# Patient Record
Sex: Female | Born: 1980 | Race: Black or African American | Hispanic: No | Marital: Single | Smoking: Never smoker
Health system: Southern US, Community
[De-identification: ages and names within clinical notes are randomized; demographics above are authoritative.]

## PROBLEM LIST (undated history)

## (undated) DIAGNOSIS — R51 Headache: Secondary | ICD-10-CM

## (undated) DIAGNOSIS — F988 Other specified behavioral and emotional disorders with onset usually occurring in childhood and adolescence: Secondary | ICD-10-CM

## (undated) DIAGNOSIS — T7840XA Allergy, unspecified, initial encounter: Secondary | ICD-10-CM

## (undated) HISTORY — PX: CHOLECYSTECTOMY: SHX55

## (undated) HISTORY — DX: Headache: R51

## (undated) HISTORY — DX: Allergy, unspecified, initial encounter: T78.40XA

## (undated) HISTORY — DX: Other specified behavioral and emotional disorders with onset usually occurring in childhood and adolescence: F98.8

---
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
## 2001-02-24 ENCOUNTER — Emergency Department (HOSPITAL_COMMUNITY): Admission: EM | Admit: 2001-02-24 | Discharge: 2001-02-24 | Payer: Self-pay | Admitting: Emergency Medicine

## 2001-02-24 ENCOUNTER — Encounter: Payer: Self-pay | Admitting: Emergency Medicine

## 2002-02-19 ENCOUNTER — Encounter: Payer: Self-pay | Admitting: Emergency Medicine

## 2002-02-19 ENCOUNTER — Emergency Department (HOSPITAL_COMMUNITY): Admission: AC | Admit: 2002-02-19 | Discharge: 2002-02-19 | Payer: Self-pay

## 2004-02-17 ENCOUNTER — Ambulatory Visit: Payer: Self-pay | Admitting: Family Medicine

## 2004-03-09 ENCOUNTER — Ambulatory Visit: Payer: Self-pay | Admitting: Family Medicine

## 2005-06-01 ENCOUNTER — Ambulatory Visit: Payer: Self-pay | Admitting: Family Medicine

## 2005-06-04 ENCOUNTER — Encounter: Admission: RE | Admit: 2005-06-04 | Discharge: 2005-06-04 | Payer: Self-pay | Admitting: Family Medicine

## 2005-06-05 ENCOUNTER — Ambulatory Visit: Payer: Self-pay | Admitting: Family Medicine

## 2005-06-27 ENCOUNTER — Ambulatory Visit (HOSPITAL_COMMUNITY): Admission: RE | Admit: 2005-06-27 | Discharge: 2005-06-27 | Payer: Self-pay | Admitting: Surgery

## 2005-06-27 ENCOUNTER — Encounter (INDEPENDENT_AMBULATORY_CARE_PROVIDER_SITE_OTHER): Payer: Self-pay | Admitting: *Deleted

## 2005-09-13 ENCOUNTER — Ambulatory Visit: Payer: Self-pay | Admitting: Family Medicine

## 2005-09-13 ENCOUNTER — Encounter: Payer: Self-pay | Admitting: Family Medicine

## 2005-09-13 ENCOUNTER — Other Ambulatory Visit: Admission: RE | Admit: 2005-09-13 | Discharge: 2005-09-13 | Payer: Self-pay | Admitting: Family Medicine

## 2006-02-25 ENCOUNTER — Ambulatory Visit: Payer: Self-pay | Admitting: Family Medicine

## 2006-04-30 ENCOUNTER — Ambulatory Visit: Payer: Self-pay | Admitting: Family Medicine

## 2006-10-24 ENCOUNTER — Telehealth (INDEPENDENT_AMBULATORY_CARE_PROVIDER_SITE_OTHER): Payer: Self-pay | Admitting: *Deleted

## 2006-11-01 ENCOUNTER — Ambulatory Visit: Payer: Self-pay | Admitting: Family Medicine

## 2006-11-01 DIAGNOSIS — J45909 Unspecified asthma, uncomplicated: Secondary | ICD-10-CM

## 2006-11-01 DIAGNOSIS — F988 Other specified behavioral and emotional disorders with onset usually occurring in childhood and adolescence: Secondary | ICD-10-CM | POA: Insufficient documentation

## 2006-11-01 DIAGNOSIS — J309 Allergic rhinitis, unspecified: Secondary | ICD-10-CM | POA: Insufficient documentation

## 2007-06-14 ENCOUNTER — Inpatient Hospital Stay (HOSPITAL_COMMUNITY): Admission: AD | Admit: 2007-06-14 | Discharge: 2007-06-14 | Payer: Self-pay | Admitting: Obstetrics and Gynecology

## 2007-06-14 ENCOUNTER — Inpatient Hospital Stay (HOSPITAL_COMMUNITY): Admission: AD | Admit: 2007-06-14 | Discharge: 2007-06-17 | Payer: Self-pay | Admitting: Obstetrics and Gynecology

## 2007-07-22 ENCOUNTER — Ambulatory Visit: Payer: Self-pay | Admitting: Family Medicine

## 2007-07-22 DIAGNOSIS — R519 Headache, unspecified: Secondary | ICD-10-CM | POA: Insufficient documentation

## 2007-07-22 DIAGNOSIS — R51 Headache: Secondary | ICD-10-CM

## 2007-07-22 DIAGNOSIS — M79609 Pain in unspecified limb: Secondary | ICD-10-CM

## 2008-02-22 DIAGNOSIS — J069 Acute upper respiratory infection, unspecified: Secondary | ICD-10-CM | POA: Insufficient documentation

## 2008-02-24 ENCOUNTER — Ambulatory Visit: Payer: Self-pay | Admitting: Family Medicine

## 2008-02-25 ENCOUNTER — Telehealth: Payer: Self-pay | Admitting: Family Medicine

## 2008-02-26 ENCOUNTER — Encounter: Payer: Self-pay | Admitting: Family Medicine

## 2008-04-19 ENCOUNTER — Telehealth: Payer: Self-pay | Admitting: *Deleted

## 2008-04-20 ENCOUNTER — Encounter: Payer: Self-pay | Admitting: Family Medicine

## 2009-02-28 ENCOUNTER — Telehealth (INDEPENDENT_AMBULATORY_CARE_PROVIDER_SITE_OTHER): Payer: Self-pay | Admitting: *Deleted

## 2009-05-26 ENCOUNTER — Ambulatory Visit: Payer: Self-pay | Admitting: Family Medicine

## 2009-05-26 DIAGNOSIS — D649 Anemia, unspecified: Secondary | ICD-10-CM

## 2009-05-26 DIAGNOSIS — R1084 Generalized abdominal pain: Secondary | ICD-10-CM

## 2009-05-26 LAB — CONVERTED CEMR LAB: Hemoglobin: 15.4 g/dL

## 2009-08-11 ENCOUNTER — Telehealth: Payer: Self-pay | Admitting: *Deleted

## 2010-03-21 NOTE — Progress Notes (Signed)
Summary: Pt req script for Adderall XR  Phone Note Refill Request Call back at Home Phone [phone]   Refills Requested: Medication #1:  ADDERALL XR 20 MG XR24H-CAP Take 1 capsule by mouth once a day   Supply Requested: 1 month  Method Requested: Pick up at Office Initial call taken by: Lucy Antigua,  August 11, 2009 11:00 AM    Prescriptions: ADDERALL XR 20 MG XR24H-CAP (AMPHETAMINE-DEXTROAMPHETAMINE) Take 1 capsule by mouth once a day --FILL IN TWO MONTHS  #30 x 0   Entered by:   Kern Reap CMA (AAMA)   Authorized by:   Roderick Pee MD   Signed by:   Kern Reap CMA (AAMA) on 08/11/2009   Method used:   Print then Give to Patient   RxID:   [id number] ADDERALL XR 20 MG XR24H-CAP (AMPHETAMINE-DEXTROAMPHETAMINE) Take 1 capsule by mouth once a day --FILL IN ONE MONTH  #30 x 0   Entered by:   Kern Reap CMA (AAMA)   Authorized by:   Roderick Pee MD   Signed by:   Kern Reap CMA (AAMA) on 08/11/2009   Method used:   Print then Give to Patient   RxID:   [id number] ADDERALL XR 20 MG XR24H-CAP (AMPHETAMINE-DEXTROAMPHETAMINE) Take 1 capsule by mouth once a day  #30 x 0   Entered by:   Kern Reap CMA (AAMA)   Authorized by:   Roderick Pee MD   Signed by:   Kern Reap CMA (AAMA) on 08/11/2009   Method used:   Print then Give to Patient   RxID:   [id number]

## 2010-03-21 NOTE — Progress Notes (Signed)
Summary: adderall rx lower dose  Phone Note Call from Patient Call back at Home Phone [phone]   Call For: todd Summary of Call: Wants prescription for lower dose of the adderall, the first he prescribed before the 25.  Call for questions or when ready to be picked up.   Also she is calling her pharmacy to get refill of the allegra d. Initial call taken by: Rudy Jew, RN,  February 28, 2009 10:54 AM  Follow-up for Phone Call        decrease dose to 20 mg, dispense 30 tablets, directions one daily x 3 months???????? regular or long-acting???????? please call patient  I recommend over-the-counter Claritin, 10 mg in the morning or 10 mg a Zyrtec plain  at bedtime  .Marland Kitchenno D Follow-up by: Roderick Pee MD,  February 28, 2009 10:58 AM  Additional Follow-up for Phone Call Additional follow up Details #1::        She doesn't know about the regular or longacting, but says the same as she had before.  About the claritin or zyrtec no D, she said okay , but she took the allegra D before with the adderall & asks why no D.  Please call her when the Rx is ready for pickup. Additional Follow-up by: Rudy Jew, RN,  February 28, 2009 1:45 PM    Additional Follow-up for Phone Call Additional follow up Details #2::    ok for rx x 3 mo    20 mg ? reg or XR ?????? Follow-up by: Roderick Pee MD,  February 28, 2009 6:51 PM  Additional Follow-up for Phone Call Additional follow up Details #3:: Details for Additional Follow-up Action Taken: on paper chart pt was on adderall 20mg  XR.  Will do Rx and wait for signature.Megan Riffle, RN  March 01, 2009 10:44 AM   patient aware prescription ready to pick up.Marland KitchenMarland KitchenMarland KitchenDoristine Castro  March 03, 2009 9:52 AM   New/Updated Medications: ADDERALL XR 20 MG XR24H-CAP (AMPHETAMINE-DEXTROAMPHETAMINE) Take 1 capsule by mouth once a day CLARITIN 10 MG TABS (LORATADINE) once daily as needed ZYRTEC ALLERGY 10 MG TABS (CETIRIZINE HCL) once daily as  needed ADDERALL XR 20 MG XR24H-CAP (AMPHETAMINE-DEXTROAMPHETAMINE) Take 1 capsule by mouth once a day --FILL IN ONE MONTH ADDERALL XR 20 MG XR24H-CAP (AMPHETAMINE-DEXTROAMPHETAMINE) Take 1 capsule by mouth once a day --FILL IN TWO MONTHS Prescriptions: ADDERALL XR 20 MG XR24H-CAP (AMPHETAMINE-DEXTROAMPHETAMINE) Take 1 capsule by mouth once a day --FILL IN TWO MONTHS  #30 x 0   Entered by:   Megan Riffle, RN   Authorized by:   Roderick Pee MD   Signed by:   Megan Riffle, RN on 03/01/2009   Method used:   Print then Give to Patient   RxID:   [id number] ADDERALL XR 20 MG XR24H-CAP (AMPHETAMINE-DEXTROAMPHETAMINE) Take 1 capsule by mouth once a day --FILL IN ONE MONTH  #30 x 0   Entered by:   Megan Riffle, RN   Authorized by:   Roderick Pee MD   Signed by:   Megan Riffle, RN on 03/01/2009   Method used:   Print then Give to Patient   RxID:   [id number] ADDERALL XR 20 MG XR24H-CAP (AMPHETAMINE-DEXTROAMPHETAMINE) Take 1 capsule by mouth once a day  #30 x 0   Entered by:   Megan Riffle, RN   Authorized by:   Roderick Pee MD   Signed by:   Megan Riffle, RN on 03/01/2009   Method used:  Print then Give to Patient   RxID:   [id number]

## 2010-03-21 NOTE — Assessment & Plan Note (Signed)
Summary: ?iron/njr   Vital Signs:  Patient profile:   30 year old female Height:      67 inches Weight:      209 pounds BMI:     32.85 Temp:     98.4 degrees F oral BP sitting:   102 / 72  (left arm) Cuff size:   regular  Vitals Entered By: Kern Reap CMA Duncan Dull) (May 26, 2009 2:53 PM) CC: iron levels Is Patient Diabetic? No   CC:  iron levels.  History of Present Illness: Megan Castro is a 30 year old single female, nonsmoker, who comes in today for evaluation of abdominal pain, and possible anemia.  About 4 years ago.  She was having abdominal pain.  It that time.  Diagnostic study showed the cholecystitis.  The gallbladder was removed, and she did well.  Since that, time.  She's had intermittent episodes of postprandial abdominal pain.  She sometimes has diarrhea.  She has no fever, vomiting, or weight loss.  She has a 29-year-old at home.  Postpartum she was anemic.  She wants to know she, is she's anemic now.  Hemoglobin 15.4.  Currently not sexually active therefore, no birth control  Allergies (verified): No Known Drug Allergies  Past History:  Past medical, surgical, family and social histories (including risk factors) reviewed for relevance to current acute and chronic problems.  Past Medical History: Reviewed history from 07/22/2007 and no changes required. Allergic rhinitis Asthma ADD childbirth x 1 to 5 weeks postpartum Headache  Family History: Reviewed history from 07/22/2007 and no changes required. Family History of Colon CA 1st degree relative <60  Social History: Reviewed history from 11/01/2006 and no changes required. Never Smoked Single Alcohol use-no  Review of Systems      See HPI  Physical Exam  General:  Well-developed,well-nourished,in no acute distress; alert,appropriate and cooperative throughout examination Abdomen:  Bowel sounds positive,abdomen soft and non-tender without masses, organomegaly or hernias noted.   Impression &  Recommendations:  Problem # 1:  ANEMIA (ICD-285.9) Assessment Improved  Orders: Hgb (85018) Fingerstick (16109)  Problem # 2:  ABDOMINAL PAIN, GENERALIZED (ICD-789.07) Assessment: New  Complete Medication List: 1)  Adderall Xr 20 Mg Xr24h-cap (Amphetamine-dextroamphetamine) .... Take 1 capsule by mouth once a day 2)  Zyrtec Allergy 10 Mg Tabs (Cetirizine hcl) .... Once daily as needed 3)  Adderall Xr 20 Mg Xr24h-cap (Amphetamine-dextroamphetamine) .... Take 1 capsule by mouth once a day --fill in one month 4)  Adderall Xr 20 Mg Xr24h-cap (Amphetamine-dextroamphetamine) .... Take 1 capsule by mouth once a day --fill in two months 5)  Questran Light 4 Gm Pack (Cholestyramine light) .Marland Kitchen.. 1 scoop qam  Patient Instructions: 1)  begin Questran one scoop daily.  Also avoid fatty foods.  Return p.r.n. Prescriptions: QUESTRAN LIGHT 4 GM PACK (CHOLESTYRAMINE LIGHT) 1 scoop qam  #PP x 11   Entered and Authorized by:   Roderick Pee MD   Signed by:   Roderick Pee MD on 05/26/2009   Method used:   Print then Give to Patient   RxID:   [id number]   Laboratory Results   Blood Tests   Date/Time Received: May 26, 2009     CBC   HGB:  15.4 g/dL   (Normal Range: 95.6-21.3 in Males, 12.0-15.0 in Females) Comments: Kern Reap CMA Duncan Dull)  May 26, 2009 3:05 PM

## 2010-04-06 ENCOUNTER — Other Ambulatory Visit: Payer: Self-pay | Admitting: Family Medicine

## 2010-04-06 DIAGNOSIS — F988 Other specified behavioral and emotional disorders with onset usually occurring in childhood and adolescence: Secondary | ICD-10-CM

## 2010-04-06 MED ORDER — AMPHETAMINE-DEXTROAMPHET ER 20 MG PO CP24: 20.0000 mg | ORAL_CAPSULE | ORAL | Status: DC

## 2010-04-06 NOTE — Telephone Encounter (Signed)
Pt needs a refill on med: Adderall .... Pt can be reached at [phone].

## 2010-04-06 NOTE — Telephone Encounter (Signed)
rx ready for pickup 

## 2010-05-14 ENCOUNTER — Emergency Department (HOSPITAL_BASED_OUTPATIENT_CLINIC_OR_DEPARTMENT_OTHER)
Admission: EM | Admit: 2010-05-14 | Discharge: 2010-05-14 | Disposition: A | Discharge status: Home / Self Care | Payer: BC Managed Care – PPO | Attending: Emergency Medicine | Admitting: Emergency Medicine

## 2010-05-14 DIAGNOSIS — J45909 Unspecified asthma, uncomplicated: Secondary | ICD-10-CM | POA: Insufficient documentation

## 2010-05-14 DIAGNOSIS — R05 Cough: Secondary | ICD-10-CM | POA: Insufficient documentation

## 2010-05-14 DIAGNOSIS — R059 Cough, unspecified: Secondary | ICD-10-CM | POA: Insufficient documentation

## 2010-07-03 ENCOUNTER — Encounter: Payer: Self-pay | Admitting: Family Medicine

## 2010-07-03 ENCOUNTER — Ambulatory Visit (INDEPENDENT_AMBULATORY_CARE_PROVIDER_SITE_OTHER): Payer: BC Managed Care – PPO | Admitting: Family Medicine

## 2010-07-03 VITALS — BP 102/80 | Temp 98.1°F | Wt 233.0 lb

## 2010-07-03 DIAGNOSIS — H0016 Chalazion left eye, unspecified eyelid: Secondary | ICD-10-CM

## 2010-07-03 DIAGNOSIS — H0019 Chalazion unspecified eye, unspecified eyelid: Secondary | ICD-10-CM

## 2010-07-04 ENCOUNTER — Telehealth: Payer: Self-pay | Admitting: *Deleted

## 2010-07-04 MED ORDER — PREDNISONE 20 MG PO TABS: 20.0000 mg | ORAL_TABLET | Freq: Every day | ORAL | Status: DC

## 2010-07-04 NOTE — Telephone Encounter (Signed)
patient  States that her adderall was to be increased.  Is this okay to fill?

## 2010-07-07 NOTE — Op Note (Signed)
NAMEJEFFRIE, STANDER              ACCOUNT NO.:  [id number]   MEDICAL RECORD NO.:  [id number]          PATIENT TYPE:  AMB   LOCATION:  DAY                          FACILITY:  [facility]   PHYSICIAN:  Thomas A. Cornett, M.D.DATE OF BIRTH:  05-Mar-1980   DATE OF PROCEDURE:  06/27/2005  DATE OF DISCHARGE:                                 OPERATIVE REPORT   PREOPERATIVE DIAGNOSIS:  Symptomatic cholelithiasis.   POSTOPERATIVE DIAGNOSIS:  Symptomatic cholelithiasis.   PROCEDURE:  Laparoscopic cholecystectomy with cholangiogram.   SURGEON:  Dr. Harriette Bouillon.   ASSISTANT:  Dr. Lebron Conners.   ANESTHESIA:  General endotracheal anesthesia with 10 mL of 0.5% Sensorcaine  local.   ESTIMATED BLOOD LOSS:  10 mL.   SPECIMEN:  Gallbladder with gallstone to pathology.   INTRAOPERATIVE FINDINGS:  Intraoperative cholangiogram revealed air bubbles  but no ductal dilatation with free flow of contrast into the duodenum and  down the common duct, up the common hepatic duct into a bifurcation.   INDICATIONS FOR PROCEDURE:  The patient is a 24-year female with symptomatic  cholelithiasis.  It has become quite disabling for her and she wished to  have surgery after having recurrent attacks requiring pain medicine and  doctor visits.  I discussed the procedure as well as the complications with  the patient which include bleeding, infection, common duct injury, injury to  other organs.  She understood the potential complications and agreed to  proceed.   DESCRIPTION OF PROCEDURE:  The patient was brought to the operating room and  placed supine.  After induction of general endotracheal anesthesia, her  abdomen was prepped and draped in a sterile fashion. A 1 cm supraumbilical  incision was made and dissection was carried down to her fascia.  The fascia  was grasped with a Kocher, a small incision was made in the fascia.  I was  able to place a small hemostat through the preperitoneal space through  the  peritoneal lining into the abdominal cavity without difficulty under direct  vision.  A pursestring suture of #0 Vicryl was placed at this point around  the fascial opening and an 11-mm Hasson cannula was placed under direct  vision.  Pneumoperitoneum was created to 15 mmHg with CO2 and a laparoscope  was placed.  Laparoscopy was performed.  No signs of solid or hollow organ  injury.  Next, a 10 mm subxiphoid port was placed under direct vision.  Two  5 mm ports were placed in the right mid abdomen, both under direct vision.  The gallbladder was identified and grasped by its dome and retracted toward  the patient's right shoulder.  A second grasper was used to grab the  infundibulum and retracted toward the patient's right lower quadrant.  There  is a very large node of Calot noted.  I was able to create a plane between  the node and the cystic duct circumferentially and dissect this out.  Once I  did this, I pushed the node away to give me more space and the cystic duct  was the only tubular structuring entering the gallbladder.  I  placed a clip  on the gallbladder side of this.  I then made a small incision in the cystic  duct and placed a cholangiogram catheter through a separate stab incision.  Intraoperative cholangiogram was then done which showed free flow of  contrast down the cystic duct into the common duct into the duodenum.  There  was free flow of contrast in the common hepatic ducts with a bifurcation to  the right and left hepatic duct.  At this point in time, there were some air  bubbles that I noted but these with flushing of contrast became more  difficult to see and then disappeared when I reviewed the films in the  operating room. They also behaved very much like air bubbles with changing  in size as I flushed.  At this point in time, I did not see any evidence of  obstruction.  These appeared to be air bubbles since I could not visualize  them anymore with further  flushing.  At this point in time, I completed the  cholangiogram, placed three clips on the cystic duct after removing the  catheter and divided the remainder of the cystic duct.  Next, the cystic  artery was identified just above the node of Calot. This was dissected out  with a right angle and two clips were placed on this side of it and this was  reduced.  At this point, the cautery was used to dissect the gallbladder  from the gallbladder fossa.  There were small posterior branches from the  cystic artery on the gallbladder that I controlled with clips in the  gallbladder bed.  I then used the cautery to dissect the remainder of the  gallbladder from gallbladder fossa without difficulty.  At this point in  time, I removed the gallbladder with an EndoCatch bag through the umbilicus.  I inspected the gallbladder bed and found it to be hemostatic.  Irrigation  was used and this was clear.  At this point in time, I removed all ports  under direct vision with no signs of port site bleeding.  Once the ports  were removed, the CO2 and the camera were released and removed respectively.  The umbilical port was removed.  I closed the umbilical port fascia with the  pursestring #0 Vicryl suture, 4-0 Monocryl was used to close all skin  incisions.  Sterile dressings were applied.  All final counts of sponge,  needle and instruments was found to be correct at this portion of the case.  The patient was awoke and taken to recovery in satisfactory condition.      Thomas A. Cornett, M.D.  Electronically Signed     TAC/MEDQ  D:  06/27/2005  T:  06/28/2005  Job:  [id number]   cc:   Tinnie Gens A. Tawanna Cooler, M.D. [facility]  [address] [place]  [place] [address]

## 2010-07-12 NOTE — Telephone Encounter (Signed)
ok 

## 2010-07-13 ENCOUNTER — Encounter: Payer: Self-pay | Admitting: Family Medicine

## 2010-07-13 NOTE — Progress Notes (Signed)
  Subjective:    Patient ID: Megan Castro, female    DOB: 12/04/1980, 31 y.o.   MRN: [id number]  HPI Ashwini  Is a 30 year old female, who comes in today for evaluation of a painful lesion on her left upper eyelid.  About a week ago.  She noticed a painful lump on her left upper eyelid is decreased in size.  No change in vision   Review of Systems    General an ophthalmologic review of systems negative Objective:   Physical Exam    Well-developed well-nourished, female, in no acute distress.  Examination of the right eye, and eyelids normal left eye normal.  There is a cystic lesion left upper eyelid consistent with a chalazion    Assessment & Plan:   chalazion  in left upper eyelid,,,,,,,,,Warm soaks,,,,,,,,, consult with Dr. Vonna Kotyk, ophthalmologist if cystic lesion persists

## 2010-07-13 NOTE — Patient Instructions (Signed)
Warm soaks 3 times daily see Dr. Vonna Kotyk, ophthalmologist if symptoms persist

## 2010-07-19 MED ORDER — AMPHETAMINE-DEXTROAMPHET ER 25 MG PO CP24: 25.0000 mg | ORAL_CAPSULE | ORAL | Status: DC

## 2010-07-19 NOTE — Telephone Encounter (Signed)
rx ready for pick up and patient is aware  

## 2010-11-14 LAB — CBC
HCT: 30.2 — ABNORMAL LOW
HCT: 36
Platelets: 288
RBC: 3.33 — ABNORMAL LOW
RDW: 13.7

## 2010-11-14 LAB — CCBB MATERNAL DONOR DRAW

## 2011-06-14 ENCOUNTER — Telehealth: Payer: Self-pay | Admitting: *Deleted

## 2011-06-14 NOTE — Telephone Encounter (Signed)
Pt. Needs a refill of her Adderal.  Please call her when it is ready.

## 2011-06-19 MED ORDER — AMPHETAMINE-DEXTROAMPHET ER 25 MG PO CP24: 25.0000 mg | ORAL_CAPSULE | ORAL | Status: DC

## 2011-06-19 NOTE — Telephone Encounter (Signed)
Rx ready for pick up.  Left message on machine for patient.  Office visit needed for more refills

## 2011-09-20 ENCOUNTER — Emergency Department (HOSPITAL_BASED_OUTPATIENT_CLINIC_OR_DEPARTMENT_OTHER)
Admission: EM | Admit: 2011-09-20 | Discharge: 2011-09-20 | Disposition: A | Discharge status: Home / Self Care | Payer: BC Managed Care – PPO | Attending: Emergency Medicine | Admitting: Emergency Medicine

## 2011-09-20 ENCOUNTER — Encounter (HOSPITAL_BASED_OUTPATIENT_CLINIC_OR_DEPARTMENT_OTHER): Payer: Self-pay | Admitting: Emergency Medicine

## 2011-09-20 DIAGNOSIS — E860 Dehydration: Secondary | ICD-10-CM

## 2011-09-20 DIAGNOSIS — F988 Other specified behavioral and emotional disorders with onset usually occurring in childhood and adolescence: Secondary | ICD-10-CM | POA: Insufficient documentation

## 2011-09-20 LAB — URINALYSIS, ROUTINE W REFLEX MICROSCOPIC
Ketones, ur: NEGATIVE mg/dL
Leukocytes, UA: NEGATIVE
Protein, ur: NEGATIVE mg/dL

## 2011-09-20 LAB — CBC WITH DIFFERENTIAL/PLATELET
Basophils Absolute: 0 10*3/uL (ref 0.0–0.1)
Basophils Relative: 0 % (ref 0–1)
Eosinophils Absolute: 0.3 10*3/uL (ref 0.0–0.7)
HCT: 38.3 % (ref 36.0–46.0)
Lymphocytes Relative: 39 % (ref 12–46)
MCH: 31.3 pg (ref 26.0–34.0)
MCHC: 35 g/dL (ref 30.0–36.0)
Neutro Abs: 3.2 10*3/uL (ref 1.7–7.7)
Neutrophils Relative %: 49 % (ref 43–77)
Platelets: 257 10*3/uL (ref 150–400)

## 2011-09-20 LAB — BASIC METABOLIC PANEL
BUN: 10 mg/dL (ref 6–23)
CO2: 25 mEq/L (ref 19–32)
Potassium: 3.6 mEq/L (ref 3.5–5.1)
Sodium: 139 mEq/L (ref 135–145)

## 2011-09-20 LAB — URINE MICROSCOPIC-ADD ON

## 2011-09-20 NOTE — ED Provider Notes (Signed)
History     CSN: [id number]  Arrival date & time 09/20/11  1622   First MD Initiated Contact with Patient 09/20/11 1636      Chief Complaint  Patient presents with  . Fatigue  . Neck Pain    (Consider location/radiation/quality/duration/timing/severity/associated sxs/prior treatment) Patient is a 31 y.o. female presenting with general illness. The history is provided by the patient. No language interpreter was used.  Illness  The current episode started today. The problem occurs continuously. The problem has been gradually improving. The problem is moderate. Nothing relieves the symptoms. Nothing aggravates the symptoms. Associated symptoms include nausea. Pertinent negatives include no fever. She has been eating less than usual.  Pt has a history of anemia.   Pt complains of feeling weak and nauseated.   Pt reports her neck felt stiff and sore.   Pt reports her neck is sore.   Pt admits to skipping meals.  Past Medical History  Diagnosis Date  . Allergy   . Asthma   . ADD (attention deficit disorder)   . Headache     Past Surgical History  Procedure Date  . Cholecystectomy     Family History  Problem Relation Age of Onset  . COPD Other     colon    History  Substance Use Topics  . Smoking status: Never Smoker   . Smokeless tobacco: Never Used  . Alcohol Use: Yes     occ    OB History    Grav Para Term Preterm Abortions TAB SAB Ect Mult Living                  Review of Systems  Constitutional: Negative for fever.  Gastrointestinal: Positive for nausea.  All other systems reviewed and are negative.    Allergies  Review of patient's allergies indicates no known allergies.  Home Medications   Current Outpatient Rx  Name Route Sig Dispense Refill  . EPINEPHRINE 0.3 MG/0.3ML IJ DEVI Intramuscular Inject 0.3 mg into the muscle once.    Marland Kitchen FEXOFENADINE HCL 180 MG PO TABS Oral Take 180 mg by mouth daily.      BP 109/67  Pulse 72  Temp 97.7 F (36.5 C)  (Oral)  Resp 16  Ht 5\' 7"  (1.702 m)  Wt 230 lb (104.327 kg)  BMI 36.02 kg/m2  SpO2 100%  LMP 09/19/2011  Physical Exam  Nursing note and vitals reviewed. Constitutional: She is oriented to person, place, and time. She appears well-developed and well-nourished.  HENT:  Head: Normocephalic and atraumatic.  Right Ear: External ear normal.  Left Ear: External ear normal.  Mouth/Throat: Oropharynx is clear and moist.  Eyes: Conjunctivae and EOM are normal. Pupils are equal, round, and reactive to light.  Neck: Normal range of motion. Neck supple.  Cardiovascular: Normal rate and normal heart sounds.   Pulmonary/Chest: Effort normal and breath sounds normal.  Abdominal: Soft. Bowel sounds are normal.  Musculoskeletal: Normal range of motion.  Neurological: She is alert and oriented to person, place, and time. She has normal reflexes.  Skin: Skin is warm.  Psychiatric: She has a normal mood and affect.    ED Course  Procedures (including critical care time)  Labs Reviewed - No data to display No results found.  Results for orders placed during the hospital encounter of 09/20/11  PREGNANCY, URINE      Component Value Range   Preg Test, Ur NEGATIVE  NEGATIVE  URINALYSIS, ROUTINE W REFLEX MICROSCOPIC  Component Value Range   Color, Urine YELLOW  YELLOW   APPearance CLEAR  CLEAR   Specific Gravity, Urine 1.037 (*) 1.005 - 1.030   pH 6.0  5.0 - 8.0   Glucose, UA NEGATIVE  NEGATIVE mg/dL   Hgb urine dipstick LARGE (*) NEGATIVE   Bilirubin Urine SMALL (*) NEGATIVE   Ketones, ur NEGATIVE  NEGATIVE mg/dL   Protein, ur NEGATIVE  NEGATIVE mg/dL   Urobilinogen, UA 1.0  0.0 - 1.0 mg/dL   Nitrite NEGATIVE  NEGATIVE   Leukocytes, UA NEGATIVE  NEGATIVE  CBC WITH DIFFERENTIAL      Component Value Range   WBC 6.6  4.0 - 10.5 K/uL   RBC 4.28  3.87 - 5.11 MIL/uL   Hemoglobin 13.4  12.0 - 15.0 g/dL   HCT 16.1  09.6 - 04.5 %   MCV 89.5  78.0 - 100.0 fL   MCH 31.3  26.0 - 34.0 pg    MCHC 35.0  30.0 - 36.0 g/dL   RDW 40.9  81.1 - 91.4 %   Platelets 257  150 - 400 K/uL   Neutrophils Relative 49  43 - 77 %   Neutro Abs 3.2  1.7 - 7.7 K/uL   Lymphocytes Relative 39  12 - 46 %   Lymphs Abs 2.6  0.7 - 4.0 K/uL   Monocytes Relative 7  3 - 12 %   Monocytes Absolute 0.4  0.1 - 1.0 K/uL   Eosinophils Relative 5  0 - 5 %   Eosinophils Absolute 0.3  0.0 - 0.7 K/uL   Basophils Relative 0  0 - 1 %   Basophils Absolute 0.0  0.0 - 0.1 K/uL  BASIC METABOLIC PANEL      Component Value Range   Sodium 139  135 - 145 mEq/L   Potassium 3.6  3.5 - 5.1 mEq/L   Chloride 105  96 - 112 mEq/L   CO2 25  19 - 32 mEq/L   Glucose, Bld 108 (*) 70 - 99 mg/dL   BUN 10  6 - 23 mg/dL   Creatinine, Ser 7.82  0.50 - 1.10 mg/dL   Calcium 8.9  8.4 - 95.6 mg/dL   GFR calc non Af Amer >90  >90 mL/min   GFR calc Af Amer >90  >90 mL/min  URINE MICROSCOPIC-ADD ON      Component Value Range   Squamous Epithelial / LPF RARE  RARE   WBC, UA 0-2  <3 WBC/hpf   RBC / HPF 11-20  <3 RBC/hpf   Bacteria, UA FEW (*) RARE   No results found.  1. Dehydration       MDM  Pt advised to increase po fluids, rest, tylenol for neck pain        Lonia Skinner [place], [place] 09/20/11 1829

## 2011-09-20 NOTE — ED Notes (Signed)
States all day she has felt weak and fatigued.  Thought might be blood sugar but did not feel any better after eating lunch.  Denies n/v/d.  Reports some mild SOB.

## 2011-09-20 NOTE — ED Notes (Signed)
Pt reports feeling "very weird." Pt also reports sharp, stabbing pain that goes across her shoulders and up her neck. Started today and states that it began when she was standing and that laying her head down helped relieve some of the pain. Also reports extreme fatigue starting today.

## 2011-09-21 NOTE — ED Provider Notes (Signed)
Medical screening examination/treatment/procedure(s) were performed by non-physician practitioner and as supervising physician I was immediately available for consultation/collaboration.   Gwyneth Sprout, MD 09/21/11 0009

## 2012-05-16 ENCOUNTER — Ambulatory Visit (INDEPENDENT_AMBULATORY_CARE_PROVIDER_SITE_OTHER): Payer: Self-pay | Admitting: Internal Medicine

## 2012-05-16 ENCOUNTER — Encounter: Payer: Self-pay | Admitting: Internal Medicine

## 2012-05-16 VITALS — BP 130/80 | HR 77 | Temp 98.0°F | Resp 18 | Wt 233.0 lb

## 2012-05-16 DIAGNOSIS — R7302 Impaired glucose tolerance (oral): Secondary | ICD-10-CM

## 2012-05-16 DIAGNOSIS — D649 Anemia, unspecified: Secondary | ICD-10-CM

## 2012-05-16 DIAGNOSIS — R7309 Other abnormal glucose: Secondary | ICD-10-CM

## 2012-05-16 LAB — CBC WITH DIFFERENTIAL/PLATELET
Hemoglobin: 14 g/dL (ref 12.0–15.0)
Lymphocytes Relative: 35.1 % (ref 12.0–46.0)
MCHC: 33.5 g/dL (ref 30.0–36.0)
Neutrophils Relative %: 51.4 % (ref 43.0–77.0)
Platelets: 244 10*3/uL (ref 150.0–400.0)
RBC: 4.49 Mil/uL (ref 3.87–5.11)
RDW: 13 % (ref 11.5–14.6)
WBC: 5.4 10*3/uL (ref 4.5–10.5)

## 2012-05-16 LAB — TSH: TSH: 0.53 u[IU]/mL (ref 0.35–5.50)

## 2012-05-16 MED ORDER — AMPHETAMINE-DEXTROAMPHET ER 25 MG PO CP24: 25.0000 mg | ORAL_CAPSULE | ORAL | Status: DC

## 2012-05-16 NOTE — Progress Notes (Signed)
  Subjective:    Patient ID: Megan Castro, female    DOB: [birth year], 32 y.o.   MRN: [id number]  HPI  32 year old patient who is seen today in followup. She states that she has had a recent health assessment that included lab and was concerned about an elevated blood sugar slightly over 100. She does have a family history of diabetes. Other complaints include diffuse hair loss of several months duration area and she also is concerned about weight gain and request thyroid function studies.  Past Medical History  Diagnosis Date  . Allergy   . Asthma   . ADD (attention deficit disorder)   . Headache     History   Social History  . Marital Status: Single    Spouse Name: N/A    Number of Children: N/A  . Years of Education: N/A   Occupational History  . Not on file.   Social History Main Topics  . Smoking status: Never Smoker   . Smokeless tobacco: Never Used  . Alcohol Use: Yes     Comment: occ  . Drug Use: No  . Sexually Active: Not on file   Other Topics Concern  . Not on file   Social History Narrative  . No narrative on file    Past Surgical History  Procedure Laterality Date  . Cholecystectomy      Family History  Problem Relation Age of Onset  . COPD Other     colon    No Known Allergies  Current Outpatient Prescriptions on File Prior to Visit  Medication Sig Dispense Refill  . EPINEPHrine (EPIPEN) 0.3 mg/0.3 mL DEVI Inject 0.3 mg into the muscle once.      . fexofenadine (ALLEGRA) 180 MG tablet Take 180 mg by mouth daily.       No current facility-administered medications on file prior to visit.    BP 130/80  Pulse 77  Temp(Src) 98 F (36.7 C) (Oral)  Resp 18  Wt 233 lb (105.688 kg)  BMI 36.48 kg/m2  SpO2 98%  LMP 05/09/2012       Review of Systems  Constitutional: Positive for unexpected weight change.  HENT: Negative for hearing loss, congestion, sore throat, rhinorrhea, dental problem, sinus pressure and tinnitus.   Eyes: Negative  for pain, discharge and visual disturbance.  Respiratory: Negative for cough and shortness of breath.   Cardiovascular: Negative for chest pain, palpitations and leg swelling.  Gastrointestinal: Negative for nausea, vomiting, abdominal pain, diarrhea, constipation, blood in stool and abdominal distention.  Genitourinary: Negative for dysuria, urgency, frequency, hematuria, flank pain, vaginal bleeding, vaginal discharge, difficulty urinating, vaginal pain and pelvic pain.  Musculoskeletal: Negative for joint swelling, arthralgias and gait problem.  Skin: Negative for rash.        Hair loss  Neurological: Negative for dizziness, syncope, speech difficulty, weakness, numbness and headaches.  Hematological: Negative for adenopathy.  Psychiatric/Behavioral: Negative for behavioral problems, dysphoric mood and agitation. The patient is not nervous/anxious.        Objective:   Physical Exam  Constitutional: She appears well-developed and well-nourished. No distress.  Normal blood pressure. Weight 233  Skin:  No obvious alopecia. Scalp appeared normal          Assessment & Plan:   History of hair loss Possible impaired glucose tolerance. We'll check a fasting blood sugar and hemoglobin A1c Weight gain. We'll check a TSH

## 2012-05-16 NOTE — Patient Instructions (Signed)
It is important that you exercise regularly, at least 20 minutes 3 to 4 times per week.  If you develop chest pain or shortness of breath seek  medical attention.  Call or return to clinic prn if these symptoms worsen or fail to improve as anticipated.  

## 2013-11-17 ENCOUNTER — Ambulatory Visit: Payer: Managed Care, Other (non HMO) | Admitting: Family Medicine

## 2013-11-18 ENCOUNTER — Encounter: Payer: Self-pay | Admitting: Family Medicine

## 2013-11-18 ENCOUNTER — Ambulatory Visit (INDEPENDENT_AMBULATORY_CARE_PROVIDER_SITE_OTHER): Payer: 59 | Admitting: Family Medicine

## 2013-11-18 VITALS — BP 124/84 | Temp 97.9°F | Wt 235.0 lb

## 2013-11-18 DIAGNOSIS — Z23 Encounter for immunization: Secondary | ICD-10-CM

## 2013-11-18 DIAGNOSIS — E669 Obesity, unspecified: Secondary | ICD-10-CM

## 2013-11-18 DIAGNOSIS — L80 Vitiligo: Secondary | ICD-10-CM | POA: Insufficient documentation

## 2013-11-18 DIAGNOSIS — F988 Other specified behavioral and emotional disorders with onset usually occurring in childhood and adolescence: Secondary | ICD-10-CM

## 2013-11-18 MED ORDER — AMPHETAMINE-DEXTROAMPHET ER 25 MG PO CP24: 25.0000 mg | ORAL_CAPSULE | ORAL | Status: DC

## 2013-11-18 NOTE — Progress Notes (Signed)
   Subjective:    Patient ID: Megan Castro, female    DOB: [birth year], 33 y.o.   MRN: [id number]  HPI Dory LarsenDelana is a 33 year old female single nonsmoker who comes in today to discuss restarting her ADD medication .  She was taking Adderall 25 mg long-acting daily and stopped it about a year ago now she has a new job and more responsibilities and she's having trouble difficulty focusing concentrating getting anything done.  She sees her GYN for annual Paps. She went in April had some dysplasia. Went back in July and had colposcopy which was normal. Advised to take folic acid daily. She's not on birth control not sexually active.   Review of Systems    review of systems otherwise negative Objective:   Physical Exam  Well-developed well-nourished female no acute distress vital signs stable she is afebrile except for weight 235 pounds      Assessment & Plan:  Adult ADD,,,,,,,,,,,,, restart Adderall  Obesity,,,,,,,,,,,,,,,,,,, diet exercise and weight loss followup in 2 months,,

## 2013-11-18 NOTE — Progress Notes (Signed)
Pre visit review using our clinic review tool, if applicable. No additional management support is needed unless otherwise documented below in the visit note. 

## 2013-11-18 NOTE — Patient Instructions (Signed)
Researcher Adderall 25 mg daily  Followup in 2 months  Begin a diet and exercise program...........Marland Kitchen. 1500 calories daily........ lots of water........... walk 30 minutes daily  Followup in 2 months

## 2014-01-18 ENCOUNTER — Ambulatory Visit: Payer: 59 | Admitting: Family Medicine

## 2014-04-20 ENCOUNTER — Ambulatory Visit (INDEPENDENT_AMBULATORY_CARE_PROVIDER_SITE_OTHER): Payer: 59 | Admitting: Family Medicine

## 2014-04-20 ENCOUNTER — Encounter: Payer: Self-pay | Admitting: Family Medicine

## 2014-04-20 VITALS — BP 120/80 | Temp 98.6°F | Wt 245.0 lb

## 2014-04-20 DIAGNOSIS — K915 Postcholecystectomy syndrome: Secondary | ICD-10-CM

## 2014-04-20 DIAGNOSIS — F909 Attention-deficit hyperactivity disorder, unspecified type: Secondary | ICD-10-CM

## 2014-04-20 DIAGNOSIS — F988 Other specified behavioral and emotional disorders with onset usually occurring in childhood and adolescence: Secondary | ICD-10-CM

## 2014-04-20 MED ORDER — AMPHETAMINE-DEXTROAMPHET ER 10 MG PO CP24: 10.0000 mg | ORAL_CAPSULE | Freq: Every day | ORAL | Status: DC

## 2014-04-20 MED ORDER — AMPHETAMINE-DEXTROAMPHET ER 10 MG PO CP24: 10.0000 mg | ORAL_CAPSULE | Freq: Every day | ORAL | Status: AC

## 2014-04-20 MED ORDER — CHOLESTYRAMINE LIGHT 4 GM/DOSE PO POWD: ORAL | Status: AC

## 2014-04-20 NOTE — Progress Notes (Signed)
   Subjective:    Patient ID: Katherine Mantleelana A Aird, female    DOB: July 25, 1980, 34 y.o.   MRN: [id number]  HPI Clement HusbandsDelano is a 34 year old married female nonsmoker who comes in today for evaluation of 2 problems  We diagnosed her to have ADD in first grade. She was on medicine from grades 1 through 6. She then was off her medication told age 34. She restarted for couple years and stopped when she had her baby. She feels better on the medication sheet has difficulty focusing and concentrating however the dose she's currently on is too strong.  We discussed various options which included no medication, stating with the the same drug or  switching to other medicines  Her other problem is post cholecystectomy loose bowel movements. She'll he has 2 bowel movements a day but they're loose and watery. She started having these symptoms after her gallbladder was removed   Review of Systems Review of systems otherwise negative    Objective:   Physical Exam  Well-developed well-nourished female no acute distress vital signs stable she's afebrile      Assessment & Plan:  Post cholecystectomy loose bowel movements,,,,,,, trial of cholestyramine  Adult ADD,,,,,,,,, decrease Adderall long-acting to 15 mg once daily follow-up in one month

## 2014-04-20 NOTE — Patient Instructions (Signed)
Adderall 10 mg........ long-acting......Marland Kitchen. 1 daily in the morning  Call mid-April....... [id number]..... Rachel's extension..... To let us know how this is working for [employer]you  Questran....... one scoop daily

## 2014-04-20 NOTE — Progress Notes (Signed)
Pre visit review using our clinic review tool, if applicable. No additional management support is needed unless otherwise documented below in the visit note. 

## 2014-06-04 ENCOUNTER — Encounter: Payer: Self-pay | Admitting: Internal Medicine

## 2014-06-04 ENCOUNTER — Encounter: Payer: Self-pay | Admitting: Family Medicine

## 2014-06-04 ENCOUNTER — Ambulatory Visit (INDEPENDENT_AMBULATORY_CARE_PROVIDER_SITE_OTHER): Payer: 59 | Admitting: Internal Medicine

## 2014-06-04 VITALS — BP 110/80 | Temp 98.3°F | Wt 244.3 lb

## 2014-06-04 DIAGNOSIS — R6889 Other general symptoms and signs: Secondary | ICD-10-CM | POA: Diagnosis not present

## 2014-06-04 DIAGNOSIS — J4520 Mild intermittent asthma, uncomplicated: Secondary | ICD-10-CM | POA: Diagnosis not present

## 2014-06-04 MED ORDER — ALBUTEROL SULFATE HFA 108 (90 BASE) MCG/ACT IN AERS: 2.0000 | INHALATION_SPRAY | Freq: Four times a day (QID) | RESPIRATORY_TRACT | Status: AC | PRN

## 2014-06-04 NOTE — Patient Instructions (Addendum)
This  Is a flu like illness . Antibiotics wont help  Time and take care .Marland Kitchen. Can use inhaler if needed for wheezing.  Fu if  persistent or progressive relapsing sx  Cough can last a few weeks .  Stay hydrated .    Influenza Influenza ("the flu") is a viral infection of the respiratory tract. It occurs more often in winter months because people spend more time in close contact with one another. Influenza can make you feel very sick. Influenza easily spreads from person to person (contagious). CAUSES  Influenza is caused by a virus that infects the respiratory tract. You can catch the virus by breathing in droplets from an infected person's cough or sneeze. You can also catch the virus by touching something that was recently contaminated with the virus and then touching your mouth, nose, or eyes. RISKS AND COMPLICATIONS You may be at risk for a more severe case of influenza if you smoke cigarettes, have diabetes, have chronic heart disease (such as heart failure) or lung disease (such as asthma), or if you have a weakened immune system. Elderly people and pregnant women are also at risk for more serious infections. The most common problem of influenza is a lung infection (pneumonia). Sometimes, this problem can require emergency medical care and may be life threatening. SIGNS AND SYMPTOMS  Symptoms typically last 4 to 10 days and may include:  Fever.  Chills.  Headache, body aches, and muscle aches.  Sore throat.  Chest discomfort and cough.  Poor appetite.  Weakness or feeling tired.  Dizziness.  Nausea or vomiting. DIAGNOSIS  Diagnosis of influenza is often made based on your history and a physical exam. A nose or throat swab test can be done to confirm the diagnosis. TREATMENT  In mild cases, influenza goes away on its own. Treatment is directed at relieving symptoms. For more severe cases, your health care provider may prescribe antiviral medicines to shorten the sickness.  Antibiotic medicines are not effective because the infection is caused by a virus, not by bacteria. HOME CARE INSTRUCTIONS  Take medicines only as directed by your health care provider.  Use a cool mist humidifier to make breathing easier.  Get plenty of rest until your temperature returns to normal. This usually takes 3 to 4 days.  Drink enough fluid to keep your urine clear or pale yellow.  Cover yourmouth and nosewhen coughing or sneezing,and wash your handswellto prevent thevirusfrom spreading.  Stay homefromwork orschool untilthe fever is gonefor at least 281full day. PREVENTION  An annual influenza vaccination (flu shot) is the best way to avoid getting influenza. An annual flu shot is now routinely recommended for all adults in the [place] SEEK MEDICAL CARE IF:  You experiencechest pain, yourcough worsens,or you producemore mucus.  Youhave nausea,vomiting, ordiarrhea.  Your fever returns or gets worse. SEEK IMMEDIATE MEDICAL CARE IF:  You havetrouble breathing, you become short of breath,or your skin ornails becomebluish.  You have severe painor stiffnessin the neck.  You develop a sudden headache, or pain in the face or ear.  You have nausea or vomiting that you cannot control. MAKE SURE YOU:   Understand these instructions.  Will watch your condition.  Will get help right away if you are not doing well or get worse. Document Released: 02/03/2000 Document Revised: 06/22/2013 Document Reviewed: 05/07/2011 [facility] Patient Information 2015 [place], [place]. This information is not intended to replace advice given to you by your health care provider. Make sure you discuss any questions  you have with your health care provider.

## 2014-06-04 NOTE — Progress Notes (Signed)
Pre visit review using our clinic review tool, if applicable. No additional management support is needed unless otherwise documented below in the visit note.   Chief Complaint  Patient presents with  . Fever    Started on Monday  . Cough  . Nasal Congestion  . Chills  . Headache  . Generalized Body Aches    HPI: Patient Megan Castro  comes in today for SDA for  new problem evaluation. pcp NA Onset Monday 4 days ago at night and sick more than ususal  myalgias and hot flushes  Cough  Congestion  Went ot work and had to go  home  Daughter   6 got sick first.   Better mostly. No fever  Last night  Cold and flu illness.  . Very congested.   meds otc meds  [place]   ROS: See pertinent positives and negatives per HPI. No cpsob hemoptysis new rash  Past Medical History  Diagnosis Date  . Allergy   . Asthma   . ADD (attention deficit disorder)   . Headache(784.0)     Family History  Problem Relation Age of Onset  . COPD Other     colon    History   Social History  . Marital Status: Single    Spouse Name: N/A  . Number of Children: N/A  . Years of Education: N/A   Social History Main Topics  . Smoking status: Never Smoker   . Smokeless tobacco: Never Used  . Alcohol Use: Yes     Comment: occ  . Drug Use: No  . Sexual Activity: Not on file   Other Topics Concern  . Not on file   Social History Narrative    Outpatient Encounter Prescriptions as of 06/04/2014  Medication Sig  . EPINEPHrine (EPIPEN) 0.3 mg/0.3 mL DEVI Inject 0.3 mg into the muscle once.  . fexofenadine (ALLEGRA) 180 MG tablet Take 180 mg by mouth daily.  Marland Kitchen. albuterol (PROVENTIL HFA;VENTOLIN HFA) 108 (90 BASE) MCG/ACT inhaler Inhale 2 puffs into the lungs every 6 (six) hours as needed for wheezing or shortness of breath.  . amphetamine-dextroamphetamine (ADDERALL XR) 10 MG 24 hr capsule Take 1 capsule (10 mg total) by mouth daily. (Patient not taking:  Reported on 06/04/2014)  . amphetamine-dextroamphetamine (ADDERALL XR) 10 MG 24 hr capsule Take 1 capsule (10 mg total) by mouth daily. (Patient not taking: Reported on 06/04/2014)  . cholestyramine light (PREVALITE) 4 GM/DOSE powder 1 scoop daily (Patient not taking: Reported on 06/04/2014)    EXAM:  BP 110/80 mmHg  Temp(Src) 98.3 F (36.8 C) (Oral)  Wt 244 lb 4.8 oz (110.814 kg)  Body mass index is 38.25 kg/(m^2). WDWN in NAD  quiet respirations; mildly congested  somewhat hoarse. Non toxic . HEENT: Normocephalic ;atraumatic , Eyes;  PERRL, EOMs  Full, lids and conjunctiva clear,,Ears: no deformities, canals nl, TM landmarks normal, Nose: no deformity or discharge but very congested;face minimally tender Mouth : OP clear without lesion or edema . Neck: Supple without adenopathy or masses or bruits Chest:  Clear to A&P without wheezes rales or rhonchi CV:  S1-S2 no gallops or murmurs peripheral perfusion is normal Abdomen:  Sof,t normal bowel sounds without hepatosplenomegaly, no guarding rebound or masses no CVA tenderness Skin :nl perfusion and no acute rashes   ASSESSMENT AND PLAN:  Discussed the following assessment and plan:  Flu-like symptoms - uncommplicated expectant managment  fever now  gone very congested reasuring exam.  Asthma, mild intermittent, uncomplicated   Expectant management. Support note for work  Asthma not flared at this time  Rescue inhaler given if needed and fu as advised  -Patient advised to return or notify health care team  if symptoms worsen ,persist or new concerns arise.  Patient Instructions  This  Is a flu like illness . Antibiotics wont help  Time and take care .Marland Kitchen Can use inhaler if needed for wheezing.  Fu if  persistent or progressive relapsing sx  Cough can last a few weeks .  Stay hydrated .    Influenza Influenza ("the flu") is a viral infection of the respiratory tract. It occurs more often in winter months because people spend more  time in close contact with one another. Influenza can make you feel very sick. Influenza easily spreads from person to person (contagious). CAUSES  Influenza is caused by a virus that infects the respiratory tract. You can catch the virus by breathing in droplets from an infected person's cough or sneeze. You can also catch the virus by touching something that was recently contaminated with the virus and then touching your mouth, nose, or eyes. RISKS AND COMPLICATIONS You may be at risk for a more severe case of influenza if you smoke cigarettes, have diabetes, have chronic heart disease (such as heart failure) or lung disease (such as asthma), or if you have a weakened immune system. Elderly people and pregnant women are also at risk for more serious infections. The most common problem of influenza is a lung infection (pneumonia). Sometimes, this problem can require emergency medical care and may be life threatening. SIGNS AND SYMPTOMS  Symptoms typically last 4 to 10 days and may include:  Fever.  Chills.  Headache, body aches, and muscle aches.  Sore throat.  Chest discomfort and cough.  Poor appetite.  Weakness or feeling tired.  Dizziness.  Nausea or vomiting. DIAGNOSIS  Diagnosis of influenza is often made based on your history and a physical exam. A nose or throat swab test can be done to confirm the diagnosis. TREATMENT  In mild cases, influenza goes away on its own. Treatment is directed at relieving symptoms. For more severe cases, your health care provider may prescribe antiviral medicines to shorten the sickness. Antibiotic medicines are not effective because the infection is caused by a virus, not by bacteria. HOME CARE INSTRUCTIONS  Take medicines only as directed by your health care provider.  Use a cool mist humidifier to make breathing easier.  Get plenty of rest until your temperature returns to normal. This usually takes 3 to 4 days.  Drink enough fluid to keep  your urine clear or pale yellow.  Cover yourmouth and nosewhen coughing or sneezing,and wash your handswellto prevent thevirusfrom spreading.  Stay homefromwork orschool untilthe fever is gonefor at least 54full day. PREVENTION  An annual influenza vaccination (flu shot) is the best way to avoid getting influenza. An annual flu shot is now routinely recommended for all adults in the [place] SEEK MEDICAL CARE IF:  You experiencechest pain, yourcough worsens,or you producemore mucus.  Youhave nausea,vomiting, ordiarrhea.  Your fever returns or gets worse. SEEK IMMEDIATE MEDICAL CARE IF:  You havetrouble breathing, you become short of breath,or your skin ornails becomebluish.  You have severe painor stiffnessin the neck.  You develop a sudden headache, or pain in the face or ear.  You have nausea or vomiting that you cannot control. MAKE SURE YOU:   Understand  these instructions.  Will watch your condition.  Will get help right away if you are not doing well or get worse. Document Released: 02/03/2000 Document Revised: 06/22/2013 Document Reviewed: 05/07/2011 [facility] Patient Information 2015 [place], [place]. This information is not intended to replace advice given to you by your health care provider. Make sure you discuss any questions you have with your health care provider.      Neta Mends. Panosh M.D.

## 2014-06-14 ENCOUNTER — Emergency Department (HOSPITAL_BASED_OUTPATIENT_CLINIC_OR_DEPARTMENT_OTHER)
Admission: EM | Admit: 2014-06-14 | Discharge: 2014-06-14 | Disposition: A | Discharge status: Home / Self Care | Payer: 59 | Attending: Emergency Medicine | Admitting: Emergency Medicine

## 2014-06-14 ENCOUNTER — Encounter (HOSPITAL_BASED_OUTPATIENT_CLINIC_OR_DEPARTMENT_OTHER): Payer: Self-pay | Admitting: *Deleted

## 2014-06-14 DIAGNOSIS — M545 Low back pain, unspecified: Secondary | ICD-10-CM

## 2014-06-14 DIAGNOSIS — Z3202 Encounter for pregnancy test, result negative: Secondary | ICD-10-CM | POA: Diagnosis not present

## 2014-06-14 DIAGNOSIS — F909 Attention-deficit hyperactivity disorder, unspecified type: Secondary | ICD-10-CM | POA: Insufficient documentation

## 2014-06-14 DIAGNOSIS — J45909 Unspecified asthma, uncomplicated: Secondary | ICD-10-CM | POA: Insufficient documentation

## 2014-06-14 DIAGNOSIS — Z79899 Other long term (current) drug therapy: Secondary | ICD-10-CM | POA: Diagnosis not present

## 2014-06-14 LAB — URINALYSIS, ROUTINE W REFLEX MICROSCOPIC
BILIRUBIN URINE: NEGATIVE
Glucose, UA: NEGATIVE mg/dL
HGB URINE DIPSTICK: NEGATIVE
Ketones, ur: NEGATIVE mg/dL
NITRITE: NEGATIVE
PH: 7.5 (ref 5.0–8.0)
Protein, ur: NEGATIVE mg/dL
Specific Gravity, Urine: 1.021 (ref 1.005–1.030)
Urobilinogen, UA: 1 mg/dL (ref 0.0–1.0)

## 2014-06-14 LAB — URINE MICROSCOPIC-ADD ON

## 2014-06-14 LAB — PREGNANCY, URINE: PREG TEST UR: NEGATIVE

## 2014-06-14 MED ORDER — KETOROLAC TROMETHAMINE 60 MG/2ML IM SOLN
60.0000 mg | Freq: Once | INTRAMUSCULAR | Status: AC
  Administered 2014-06-14: 60 mg via INTRAMUSCULAR
  Filled 2014-06-14: qty 2

## 2014-06-14 MED ORDER — HYDROCODONE-ACETAMINOPHEN 5-325 MG PO TABS: 1.0000 | ORAL_TABLET | Freq: Four times a day (QID) | ORAL | Status: AC | PRN

## 2014-06-14 NOTE — Discharge Instructions (Signed)
Ibuprofen 600 mg three times daily for the next 5 days.  Hydrocodone as prescribed as needed for pain not relieved with ibuprofen.  Follow up with your primary doctor if not improving in the next week.   Back Pain, Adult Low back pain is very common. About 1 in 5 people have back pain.The cause of low back pain is rarely dangerous. The pain often gets better over time.About half of people with a sudden onset of back pain feel better in just 2 weeks. About 8 in 10 people feel better by 6 weeks.  CAUSES Some common causes of back pain include:  Strain of the muscles or ligaments supporting the spine.  Wear and tear (degeneration) of the spinal discs.  Arthritis.  Direct injury to the back. DIAGNOSIS Most of the time, the direct cause of low back pain is not known.However, back pain can be treated effectively even when the exact cause of the pain is unknown.Answering your caregiver's questions about your overall health and symptoms is one of the most accurate ways to make sure the cause of your pain is not dangerous. If your caregiver needs more information, he or she may order lab work or imaging tests (X-rays or MRIs).However, even if imaging tests show changes in your back, this usually does not require surgery. HOME CARE INSTRUCTIONS For many people, back pain returns.Since low back pain is rarely dangerous, it is often a condition that people can learn to [facility] their own.   Remain active. It is stressful on the back to sit or stand in one place. Do not sit, drive, or stand in one place for more than 30 minutes at a time. Take short walks on level surfaces as soon as pain allows.Try to increase the length of time you walk each day.  Do not stay in bed.Resting more than 1 or 2 days can delay your recovery.  Do not avoid exercise or work.Your body is made to move.It is not dangerous to be active, even though your back may hurt.Your back will likely heal faster if you return  to being active before your pain is gone.  Pay attention to your body when you bend and lift. Many people have less discomfortwhen lifting if they bend their knees, keep the load close to their bodies,and avoid twisting. Often, the most comfortable positions are those that put less stress on your recovering back.  Find a comfortable position to sleep. Use a firm mattress and lie on your side with your knees slightly bent. If you lie on your back, put a pillow under your knees.  Only take over-the-counter or prescription medicines as directed by your caregiver. Over-the-counter medicines to reduce pain and inflammation are often the most helpful.Your caregiver may prescribe muscle relaxant drugs.These medicines help dull your pain so you can more quickly return to your normal activities and healthy exercise.  Put ice on the injured area.  Put ice in a plastic bag.  Place a towel between your skin and the bag.  Leave the ice on for 15-20 minutes, 03-04 times a day for the first 2 to 3 days. After that, ice and heat may be alternated to reduce pain and spasms.  Ask your caregiver about trying back exercises and gentle massage. This may be of some benefit.  Avoid feeling anxious or stressed.Stress increases muscle tension and can worsen back pain.It is important to recognize when you are anxious or stressed and learn ways to manage it.Exercise is a great option. SEEK  MEDICAL CARE IF:  You have pain that is not relieved with rest or medicine.  You have pain that does not improve in 1 week.  You have new symptoms.  You are generally not feeling well. SEEK IMMEDIATE MEDICAL CARE IF:   You have pain that radiates from your back into your legs.  You develop new bowel or bladder control problems.  You have unusual weakness or numbness in your arms or legs.  You develop nausea or vomiting.  You develop abdominal pain.  You feel faint. Document Released: 02/05/2005 Document  Revised: 08/07/2011 Document Reviewed: 06/09/2013 [facility] Patient Information 2015 [place], [place]. This information is not intended to replace advice given to you by your health care provider. Make sure you discuss any questions you have with your health care provider.

## 2014-06-14 NOTE — ED Provider Notes (Signed)
CSN: [id number]     Arrival date & time 06/14/14  1541 History  This chart was scribed for Geoffery Lyons, MD by Ronney Lion, ED Scribe. This patient was seen in room MH01/MH01 and the patient's care was started at 5:40 PM.      Chief Complaint  Patient presents with  . Back Pain   Patient is a 34 y.o. female presenting with back pain. The history is provided by the patient. No language interpreter was used.  Back Pain Location:  Lumbar spine Radiates to:  Does not radiate Pain severity:  Moderate Pain is:  Unable to specify Onset quality:  Sudden Duration:  9 hours Timing:  Constant Progression:  Unchanged Chronicity:  New Context: not lifting heavy objects and not recent injury   Relieved by: certain positions. Exacerbated by: ambulation, position changes. Ineffective treatments:  None tried Associated symptoms: no dysuria, no numbness and no weakness      HPI Comments: Megan Castro is a 34 y.o. female who presents to the Emergency Department complaining of sudden onset, constant lower back pain that began when she woke up this morning.  She states she slept on the floor of her daughter's room last night, but otherwise denies any known trauma or injury. She states that walking, standing up from sitting, and leaning backwards exacerbate the pain. Certain positions alleviate it. Patient has a prescription for Adderall that she does not take, as well as seasonal allergies; otherwise no chronic medical conditions, per patient. She denies any bowel or bladder incontinence, hematuria, dysuria, extremity weakness, numbness, or tingling. Patient drove here today.  Past Medical History  Diagnosis Date  . Allergy   . Asthma   . ADD (attention deficit disorder)   . OZHYQMVH(846.9)    Past Surgical History  Procedure Laterality Date  . Cholecystectomy     Family History  Problem Relation Age of Onset  . COPD Other     colon   History  Substance Use Topics  . Smoking status: Never  Smoker   . Smokeless tobacco: Never Used  . Alcohol Use: Yes     Comment: occ   OB History    No data available     Review of Systems  Genitourinary: Negative for dysuria and hematuria.  Musculoskeletal: Positive for back pain.  Neurological: Negative for weakness and numbness.    Allergies  Review of patient's allergies indicates no known allergies.  Home Medications   Prior to Admission medications   Medication Sig Start Date End Date Taking? Authorizing Provider  albuterol (PROVENTIL HFA;VENTOLIN HFA) 108 (90 BASE) MCG/ACT inhaler Inhale 2 puffs into the lungs every 6 (six) hours as needed for wheezing or shortness of breath. 06/04/14   Madelin Headings, MD  amphetamine-dextroamphetamine (ADDERALL XR) 10 MG 24 hr capsule Take 1 capsule (10 mg total) by mouth daily. Patient not taking: Reported on 06/04/2014 04/20/14   Roderick Pee, MD  amphetamine-dextroamphetamine (ADDERALL XR) 10 MG 24 hr capsule Take 1 capsule (10 mg total) by mouth daily. Patient not taking: Reported on 06/04/2014 04/20/14   Roderick Pee, MD  cholestyramine light (PREVALITE) 4 GM/DOSE powder 1 scoop daily Patient not taking: Reported on 06/04/2014 04/20/14   Roderick Pee, MD  EPINEPHrine (EPIPEN) 0.3 mg/0.3 mL DEVI Inject 0.3 mg into the muscle once.    Historical Provider, MD  fexofenadine (ALLEGRA) 180 MG tablet Take 180 mg by mouth daily.    Historical Provider, MD   BP 102/71 mmHg  Pulse 61  Temp(Src) 98.3 F (36.8 C) (Oral)  Resp 18  Ht 5\' 9"  (1.753 m)  Wt 244 lb (110.678 kg)  BMI 36.02 kg/m2  SpO2 100%  LMP 05/24/2014 Physical Exam  Constitutional: She is oriented to person, place, and time. She appears well-developed and well-nourished. No distress.  HENT:  Head: Normocephalic and atraumatic.  Eyes: Conjunctivae and EOM are normal.  Neck: Neck supple. No tracheal deviation present.  Cardiovascular: Normal rate.   Pulmonary/Chest: Effort normal. No respiratory distress.  Musculoskeletal:  Normal range of motion. She exhibits tenderness.  TTP in the soft tissues of the lumbar region.  Neurological: She is alert and oriented to person, place, and time.  DTRs are 1+ and symmetrical in the Patellar and Achilles' tendons. Strength is 5/5 in the BLE and able to walk on heels and toes without difficulty.  Skin: Skin is warm and dry.  Psychiatric: She has a normal mood and affect. Her behavior is normal.  Nursing note and vitals reviewed.   ED Course  Procedures (including critical care time)  DIAGNOSTIC STUDIES: Oxygen Saturation is 100% on room air, normal by my interpretation.    COORDINATION OF CARE: 5:45 PM - Discussed treatment plan with pt at bedside which includes anti-inflammatory medication, including Advil and Rx anti-inflammatory medication, and pt agreed to plan.   Labs Review Labs Reviewed  URINALYSIS, ROUTINE W REFLEX MICROSCOPIC - Abnormal; Notable for the following:    APPearance CLOUDY (*)    Leukocytes, UA SMALL (*)    All other components within normal limits  URINE MICROSCOPIC-ADD ON - Abnormal; Notable for the following:    Squamous Epithelial / LPF FEW (*)    All other components within normal limits  PREGNANCY, URINE    MDM   Final diagnoses:  None   Patient presents with low back pain since this morning.  There was no injury or trauma with the exception of sleeping on the floor with her child last night.  There are no red flags to suggest an emergent situation.  Will treat with nsaids, pain meds, prn follow up if not improving in the next week.  I personally performed the services described in this documentation, which was scribed in my presence. The recorded information has been reviewed and is accurate.    Geoffery Lyonsouglas Allanna Bresee, MD 06/15/14 [phone]

## 2014-06-14 NOTE — ED Notes (Signed)
Woke this am with lower back pain. No known injury.

## 2014-12-24 ENCOUNTER — Other Ambulatory Visit: Payer: Self-pay

## 2014-12-24 ENCOUNTER — Encounter (HOSPITAL_BASED_OUTPATIENT_CLINIC_OR_DEPARTMENT_OTHER): Payer: Self-pay | Admitting: *Deleted

## 2014-12-24 ENCOUNTER — Emergency Department (HOSPITAL_BASED_OUTPATIENT_CLINIC_OR_DEPARTMENT_OTHER): Payer: 59

## 2014-12-24 ENCOUNTER — Emergency Department (HOSPITAL_BASED_OUTPATIENT_CLINIC_OR_DEPARTMENT_OTHER)
Admission: EM | Admit: 2014-12-24 | Discharge: 2014-12-24 | Disposition: A | Discharge status: Home / Self Care | Payer: 59 | Attending: Emergency Medicine | Admitting: Emergency Medicine

## 2014-12-24 DIAGNOSIS — J45909 Unspecified asthma, uncomplicated: Secondary | ICD-10-CM | POA: Insufficient documentation

## 2014-12-24 DIAGNOSIS — F909 Attention-deficit hyperactivity disorder, unspecified type: Secondary | ICD-10-CM | POA: Diagnosis not present

## 2014-12-24 DIAGNOSIS — M546 Pain in thoracic spine: Secondary | ICD-10-CM | POA: Diagnosis not present

## 2014-12-24 DIAGNOSIS — Z3202 Encounter for pregnancy test, result negative: Secondary | ICD-10-CM | POA: Diagnosis not present

## 2014-12-24 DIAGNOSIS — R079 Chest pain, unspecified: Secondary | ICD-10-CM | POA: Diagnosis present

## 2014-12-24 DIAGNOSIS — Z79899 Other long term (current) drug therapy: Secondary | ICD-10-CM | POA: Diagnosis not present

## 2014-12-24 LAB — CBC WITH DIFFERENTIAL/PLATELET
Basophils Absolute: 0 10*3/uL (ref 0.0–0.1)
Basophils Relative: 0 %
EOS PCT: 5 %
Eosinophils Absolute: 0.3 10*3/uL (ref 0.0–0.7)
HCT: 41.2 % (ref 36.0–46.0)
Hemoglobin: 14.1 g/dL (ref 12.0–15.0)
Lymphocytes Relative: 33 %
Lymphs Abs: 2 10*3/uL (ref 0.7–4.0)
MCH: 30.5 pg (ref 26.0–34.0)
MCHC: 34.2 g/dL (ref 30.0–36.0)
MCV: 89.2 fL (ref 78.0–100.0)
MONOS PCT: 10 %
Monocytes Absolute: 0.6 10*3/uL (ref 0.1–1.0)
Neutro Abs: 3.2 10*3/uL (ref 1.7–7.7)
Neutrophils Relative %: 52 %
PLATELETS: 228 10*3/uL (ref 150–400)
RBC: 4.62 MIL/uL (ref 3.87–5.11)
RDW: 13.4 % (ref 11.5–15.5)
WBC: 6.2 10*3/uL (ref 4.0–10.5)

## 2014-12-24 LAB — TROPONIN I: Troponin I: <0.03 ng/mL (ref <0.031)

## 2014-12-24 LAB — COMPREHENSIVE METABOLIC PANEL
ALBUMIN: 4.1 g/dL (ref 3.5–5.0)
ALT: 16 U/L (ref 14–54)
AST: 20 U/L (ref 15–41)
Alkaline Phosphatase: 58 U/L (ref 38–126)
Anion gap: 7 (ref 5–15)
BUN: 10 mg/dL (ref 6–20)
CHLORIDE: 107 mmol/L (ref 101–111)
CO2: 25 mmol/L (ref 22–32)
CREATININE: 0.84 mg/dL (ref 0.44–1.00)
Calcium: 8.8 mg/dL — ABNORMAL LOW (ref 8.9–10.3)
GFR calc Af Amer: >60 mL/min (ref >60)
GFR calc non Af Amer: >60 mL/min (ref >60)
GLUCOSE: 95 mg/dL (ref 65–99)
Potassium: 3.5 mmol/L (ref 3.5–5.1)
Sodium: 139 mmol/L (ref 135–145)
Total Bilirubin: 1.1 mg/dL (ref 0.3–1.2)
Total Protein: 6.7 g/dL (ref 6.5–8.1)

## 2014-12-24 LAB — D-DIMER, QUANTITATIVE: D-Dimer, Quant: <0.27 ug/mL-FEU (ref 0.00–0.48)

## 2014-12-24 LAB — PREGNANCY, URINE: Preg Test, Ur: NEGATIVE

## 2014-12-24 MED ORDER — ASPIRIN 81 MG PO CHEW
324.0000 mg | CHEWABLE_TABLET | Freq: Once | ORAL | Status: AC
  Administered 2014-12-24: 324 mg via ORAL
  Filled 2014-12-24: qty 4

## 2014-12-24 NOTE — Discharge Instructions (Signed)

## 2014-12-24 NOTE — ED Provider Notes (Signed)
CSN: [id number]     Arrival date & time 12/24/14  7829 History   First MD Initiated Contact with Patient 12/24/14 [phone]     Chief Complaint  Patient presents with  . Chest Pain     (Consider location/radiation/quality/duration/timing/severity/associated sxs/prior Treatment) Patient is a 34 y.o. female presenting with chest pain.  Chest Pain Pain location:  L chest Pain quality: sharp   Pain radiates to:  Mid back Pain radiates to the back: yes   Pain severity:  Severe Onset quality:  Sudden Duration:  3 hours Timing:  Constant Progression:  Unchanged Chronicity:  New Relieved by:  Nothing Exacerbated by: moving. Ineffective treatments:  None tried Associated symptoms: back pain   Associated symptoms: no abdominal pain, no cough, no diaphoresis, no fever, no headache, no nausea, no numbness, no shortness of breath (hurt to take deep breath), no syncope, not vomiting and no weakness   Associated symptoms comment:  Tues dizzy, ears ringing, lasted less than 24hr   Risk factors: no birth control, no coronary artery disease, no diabetes mellitus, no high cholesterol, no hypertension, not pregnant, no prior DVT/PE, no smoking and no surgery   Risk factors comment:  Not sure fam hx   Past Medical History  Diagnosis Date  . Allergy   . Asthma   . ADD (attention deficit disorder)   . HYQMVHQI(696.2)    Past Surgical History  Procedure Laterality Date  . Cholecystectomy     Family History  Problem Relation Age of Onset  . COPD Other     colon   Social History  Substance Use Topics  . Smoking status: Never Smoker   . Smokeless tobacco: Never Used  . Alcohol Use: Yes     Comment: occ   OB History    No data available     Review of Systems  Constitutional: Negative for fever and diaphoresis.  HENT: Negative for sore throat.   Eyes: Negative for visual disturbance.  Respiratory: Negative for cough and shortness of breath (hurt to take deep breath).   Cardiovascular:  Positive for chest pain. Negative for syncope.  Gastrointestinal: Negative for nausea, vomiting and abdominal pain.  Genitourinary: Negative for difficulty urinating.  Musculoskeletal: Positive for back pain. Negative for neck pain.  Skin: Negative for rash.  Neurological: Negative for syncope, weakness, numbness and headaches.      Allergies  Review of patient's allergies indicates no known allergies.  Home Medications   Prior to Admission medications   Medication Sig Start Date End Date Taking? Authorizing Provider  albuterol (PROVENTIL HFA;VENTOLIN HFA) 108 (90 BASE) MCG/ACT inhaler Inhale 2 puffs into the lungs every 6 (six) hours as needed for wheezing or shortness of breath. 06/04/14  Yes Madelin Headings, MD  Amino Acids (L-CARNITINE PO) Take by mouth.   Yes Historical Provider, MD  cetirizine (ZYRTEC) 10 MG tablet Take 10 mg by mouth daily.   Yes Historical Provider, MD  EPINEPHrine (EPIPEN) 0.3 mg/0.3 mL DEVI Inject 0.3 mg into the muscle once.   Yes Historical Provider, MD  Linoleic Acid Conjugated (CONJUGATED LINOLEIC ACID PO) Take by mouth.   Yes Historical Provider, MD  Multiple Vitamin Essential TABS Take by mouth.   Yes Historical Provider, MD  OVER THE COUNTER MEDICATION Tighten up extreme fat burner   Yes Historical Provider, MD  amphetamine-dextroamphetamine (ADDERALL XR) 10 MG 24 hr capsule Take 1 capsule (10 mg total) by mouth daily. Patient not taking: Reported on 06/04/2014 04/20/14   Roderick Pee,  MD  amphetamine-dextroamphetamine (ADDERALL XR) 10 MG 24 hr capsule Take 1 capsule (10 mg total) by mouth daily. Patient not taking: Reported on 06/04/2014 04/20/14   Roderick PeeJeffrey A [name], MD  cholestyramine light (PREVALITE) 4 GM/DOSE powder 1 scoop daily Patient not taking: Reported on 06/04/2014 04/20/14   Roderick PeeJeffrey A [name], MD  fexofenadine (ALLEGRA) 180 MG tablet Take 180 mg by mouth daily.    Historical Provider, MD  HYDROcodone-acetaminophen (NORCO) 5-325 MG per tablet Take 1-2  tablets by mouth every 6 (six) hours as needed. 06/14/14   Geoffery Lyonsouglas Delo, MD   BP 107/59 mmHg  Pulse 61  Temp(Src) 97.9 F (36.6 C) (Oral)  Resp 18  Ht 5\' 8"  (1.727 m)  Wt 210 lb (95.255 kg)  BMI 31.94 kg/m2  SpO2 100%  LMP 12/11/2014 Physical Exam  Constitutional: She is oriented to person, place, and time. She appears well-developed and well-nourished. No distress.  HENT:  Head: Normocephalic and atraumatic.  Eyes: Conjunctivae and EOM are normal.  Neck: Normal range of motion.  Cardiovascular: Normal rate, regular rhythm, normal heart sounds and intact distal pulses.  Exam reveals no gallop and no friction rub.   No murmur heard. Pulmonary/Chest: Effort normal and breath sounds normal. No respiratory distress. She has no wheezes. She has no rales.  Abdominal: Soft. She exhibits no distension. There is no tenderness. There is no guarding.  Musculoskeletal: She exhibits no edema or tenderness.  Thoracic paraspinal tenderness  Neurological: She is alert and oriented to person, place, and time.  Skin: Skin is warm and dry. No rash noted. She is not diaphoretic. No erythema.  Nursing note and vitals reviewed.   ED Course  Procedures (including critical care time) Labs Review Labs Reviewed  COMPREHENSIVE METABOLIC PANEL - Abnormal; Notable for the following:    Calcium 8.8 (*)    All other components within normal limits  CBC WITH DIFFERENTIAL/PLATELET  TROPONIN I  D-DIMER, QUANTITATIVE (NOT AT [facility])  PREGNANCY, URINE  TROPONIN I    Imaging Review Dg Chest 2 View  12/24/2014  CLINICAL DATA:  Acute chest pain.  Shortness of breath. EXAM: CHEST  2 VIEW COMPARISON:  None available currently. FINDINGS: The heart size and mediastinal contours are within normal limits. Both lungs are clear. No pneumothorax or pleural effusion is noted. The visualized skeletal structures are unremarkable. IMPRESSION: No active cardiopulmonary disease. Electronically Signed   By: Lupita RaiderJames  Green Jr, M.D.    On: 12/24/2014 10:03   I have personally reviewed and evaluated these images and lab results as part of my medical decision-making.   EKG Interpretation   Date/Time:  Friday December 24 2014 08:50:03 EDT Ventricular Rate:  70 PR Interval:  158 QRS Duration: 90 QT Interval:  412 QTC Calculation: 444 R Axis:   74 Text Interpretation:  Normal sinus rhythm with sinus arrhythmia Normal ECG  ED PHYSICIAN INTERPRETATION AVAILABLE IN [facility] Confirmed by  TEST, Record ([id number]) on 12/25/2014 9:17:45 AM      MDM   Final diagnoses:  Chest pain, unspecified chest pain type    34 year old female with history of asthma presents with concern of chest pain. Differential diagnosis for chest pain includes pulmonary embolus, dissection, pneumothorax, pneumonia, ACS, myocarditis, pericarditis.  EKG was done and evaluate by me and showed no acute ST changes and no signs of pericarditis. Chest x-ray was done and evaluated by me and radiology and showed no sign of pneumonia or pneumothorax. Patient is low risk Wells with negative DDimer and have  low suspicion for PE.  Doubt dissection given normal XR, bilateral pulses, lack of risk factors and negative ddimer.  Patient is low risk HEART score and had delta troponins which were both negative. Given this evaluation, history and physical have low suspicion for pulmonary embolus, pneumonia, ACS, myocarditis, pericarditis, dissection.   Patient most likely with musculoskeletal chest pain/back pain given tenderness of back on exam, however recommend close follow up within one week.  Patient discharged in stable condition with understanding of reasons to return and recommend PCP follow up.      Alvira Monday, MD 12/25/14 1108

## 2014-12-24 NOTE — ED Notes (Signed)
Patient states she was walking into the grocery store and had a sudden onset of left chest pain and sob.  Pain in radiating into her left shoulder blade.  States she two days ago she had dizziness which lasted for 24 hours.

## 2015-05-12 ENCOUNTER — Telehealth: Payer: Self-pay | Admitting: Family Medicine

## 2015-05-12 NOTE — Telephone Encounter (Signed)
Pt states she would like to go back on adderall, has not seen Dr Tawanna Coolerodd for this issue since 04/2013. 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 Another dr Janae Bridgemanmay not prescribe.  Please advise if ok to work in, or any suggestions?

## 2015-05-13 NOTE — Telephone Encounter (Signed)
Okay to fill for 30 days but will need an appointment after that.  Dr Tawanna Coolerodd will sign the prescription when he returns to the office.

## 2015-05-16 MED ORDER — AMPHETAMINE-DEXTROAMPHET ER 10 MG PO CP24: 10.0000 mg | ORAL_CAPSULE | Freq: Every day | ORAL | Status: AC

## 2015-05-16 NOTE — Telephone Encounter (Signed)
rx ready for pick up and patient is aware  

## 2016-11-09 ENCOUNTER — Encounter: Payer: Self-pay | Admitting: Family Medicine

## 2016-12-18 IMAGING — DX DG CHEST 2V
2 series · 2 of 2 positions shown · non-contrast
Comparison: None available currently.

CLINICAL DATA: Acute chest pain.  Shortness of breath.

EXAM:
CHEST  2 VIEW

[chest pa]
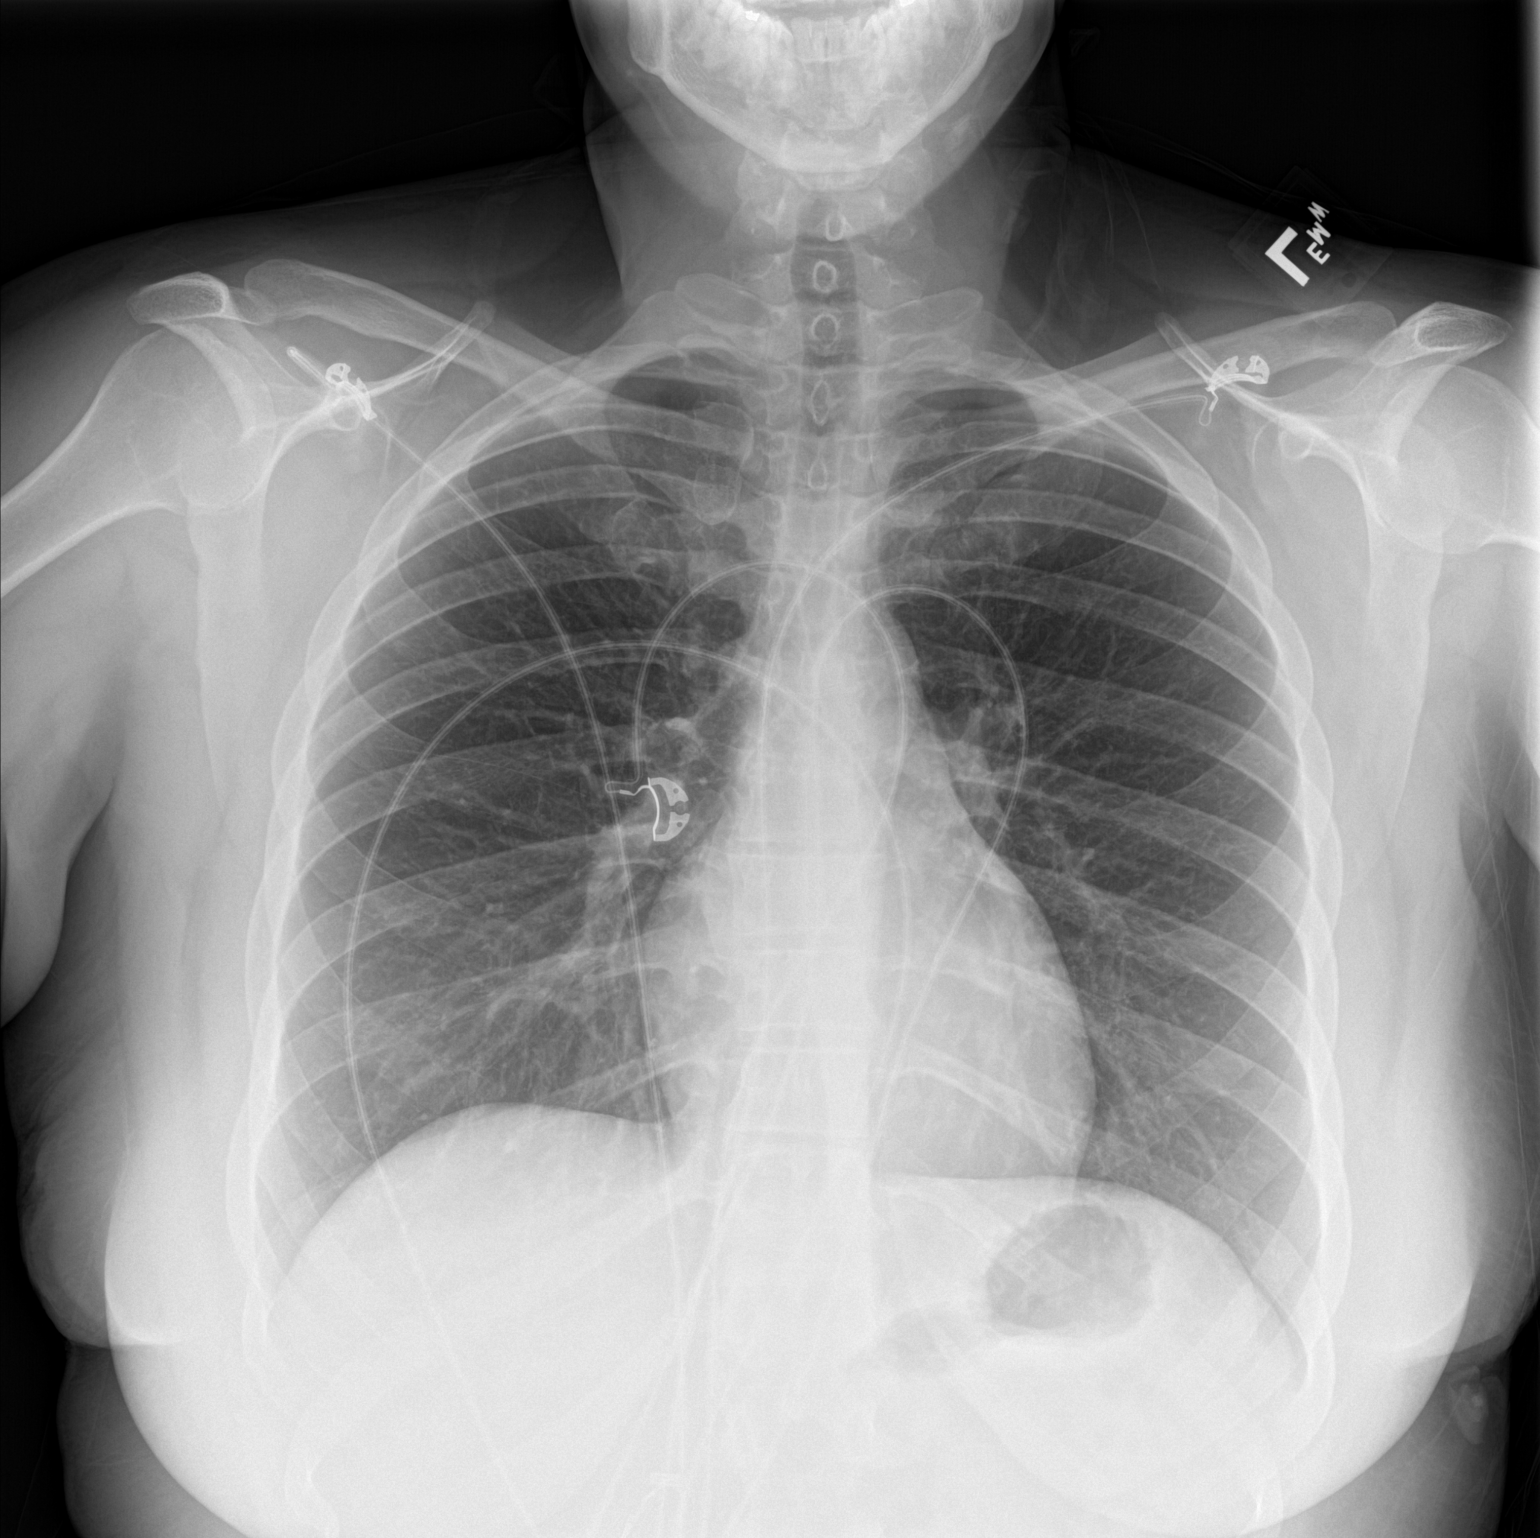

[chest lat]
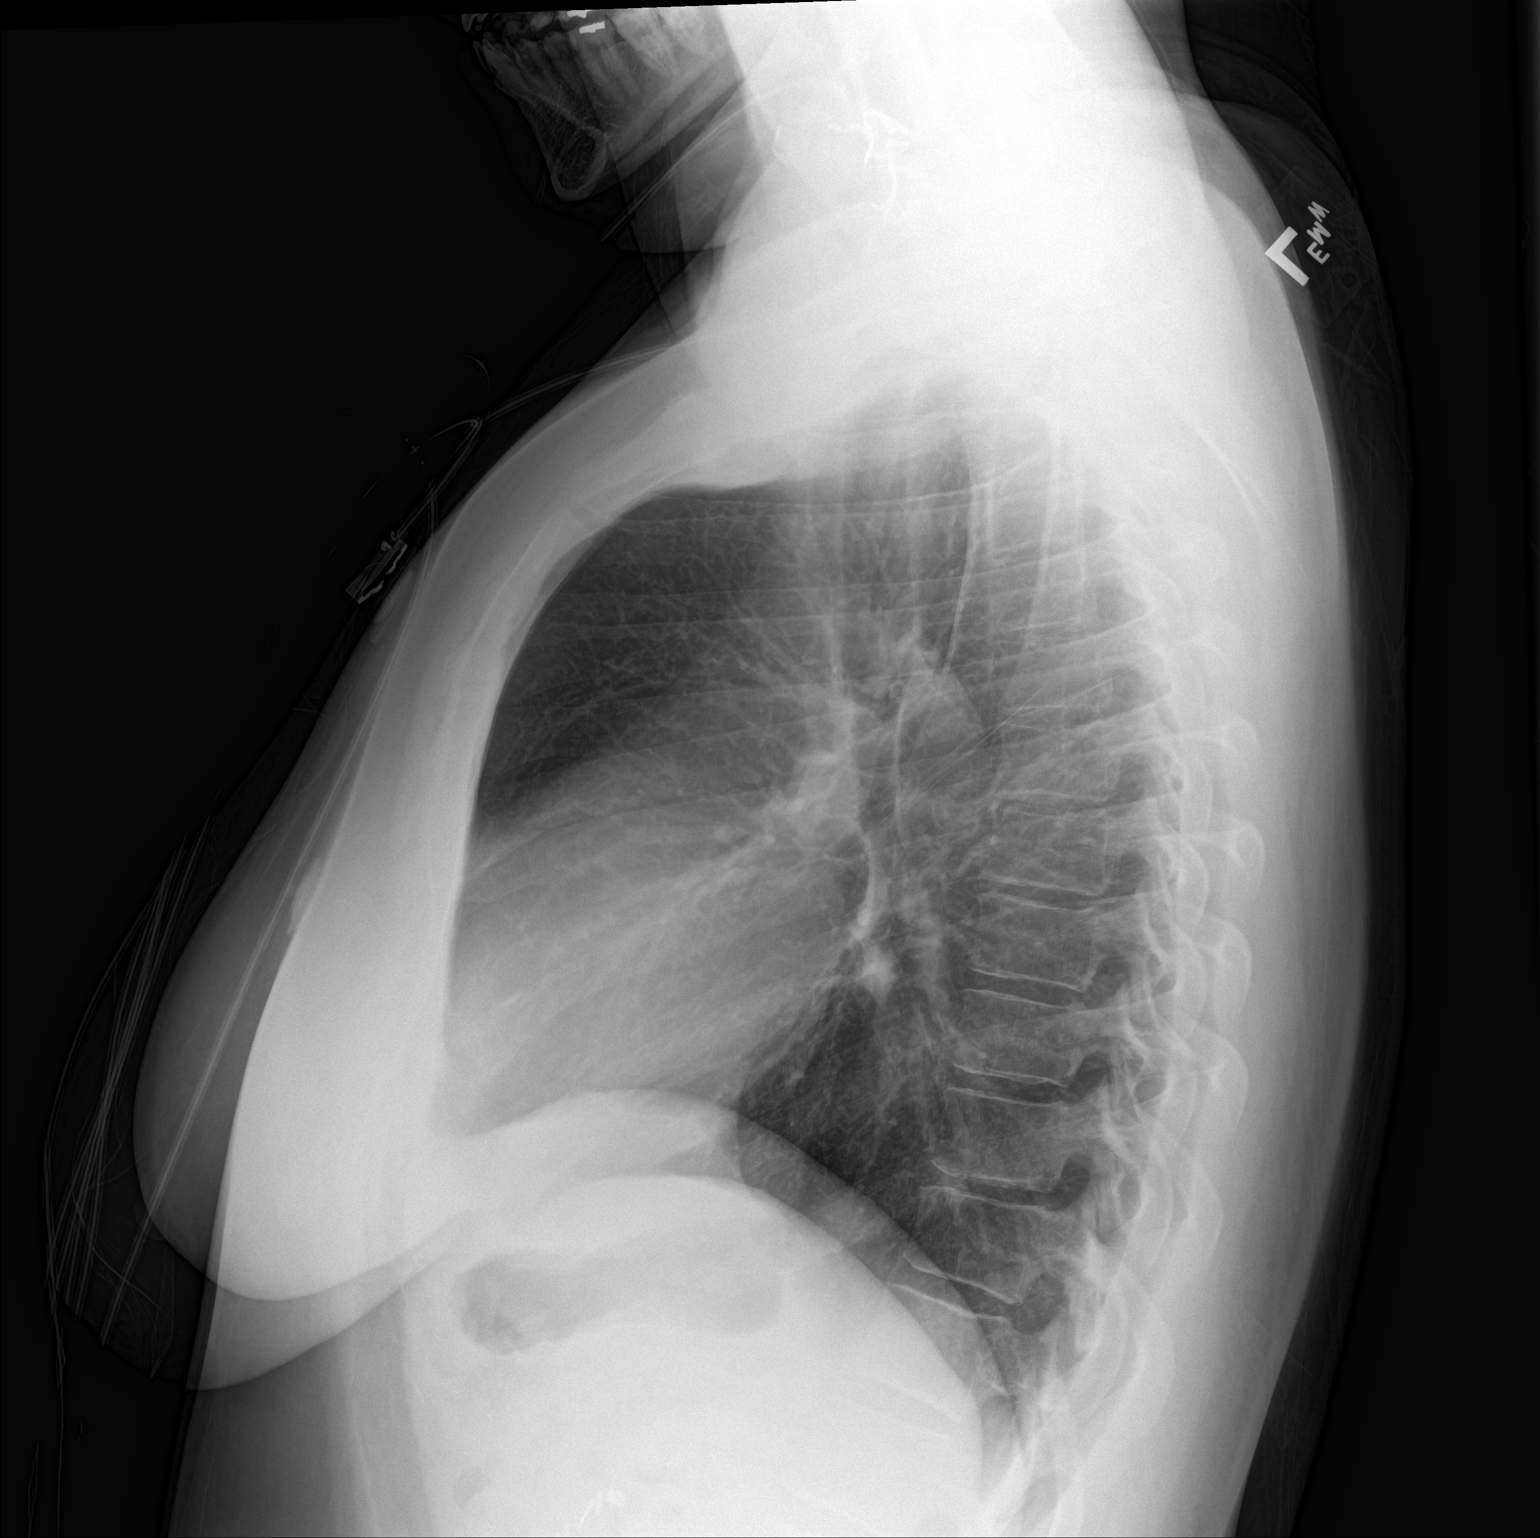

[2 of 2 positions shown; findings below may reference images not displayed]

FINDINGS: The heart size and mediastinal contours are within normal limits.
Both lungs are clear. No pneumothorax or pleural effusion is noted.
The visualized skeletal structures are unremarkable.
IMPRESSION: No active cardiopulmonary disease.

## 2018-04-11 ENCOUNTER — Emergency Department (HOSPITAL_BASED_OUTPATIENT_CLINIC_OR_DEPARTMENT_OTHER): Payer: No Typology Code available for payment source

## 2018-04-11 ENCOUNTER — Emergency Department (HOSPITAL_BASED_OUTPATIENT_CLINIC_OR_DEPARTMENT_OTHER)
Admission: EM | Admit: 2018-04-11 | Discharge: 2018-04-11 | Disposition: A | Discharge status: Home / Self Care | Payer: No Typology Code available for payment source | Attending: Emergency Medicine | Admitting: Emergency Medicine

## 2018-04-11 ENCOUNTER — Other Ambulatory Visit: Payer: Self-pay

## 2018-04-11 ENCOUNTER — Encounter (HOSPITAL_BASED_OUTPATIENT_CLINIC_OR_DEPARTMENT_OTHER): Payer: Self-pay | Admitting: *Deleted

## 2018-04-11 DIAGNOSIS — S6392XA Sprain of unspecified part of left wrist and hand, initial encounter: Secondary | ICD-10-CM | POA: Insufficient documentation

## 2018-04-11 DIAGNOSIS — S6992XA Unspecified injury of left wrist, hand and finger(s), initial encounter: Secondary | ICD-10-CM | POA: Diagnosis present

## 2018-04-11 DIAGNOSIS — Y9389 Activity, other specified: Secondary | ICD-10-CM | POA: Diagnosis not present

## 2018-04-11 DIAGNOSIS — Y929 Unspecified place or not applicable: Secondary | ICD-10-CM | POA: Diagnosis not present

## 2018-04-11 DIAGNOSIS — J45909 Unspecified asthma, uncomplicated: Secondary | ICD-10-CM | POA: Diagnosis not present

## 2018-04-11 DIAGNOSIS — Y999 Unspecified external cause status: Secondary | ICD-10-CM | POA: Insufficient documentation

## 2018-04-11 DIAGNOSIS — Z79899 Other long term (current) drug therapy: Secondary | ICD-10-CM | POA: Insufficient documentation

## 2018-04-11 DIAGNOSIS — S63502A Unspecified sprain of left wrist, initial encounter: Secondary | ICD-10-CM

## 2018-04-11 MED ORDER — IBUPROFEN 800 MG PO TABS: 800.0000 mg | ORAL_TABLET | Freq: Three times a day (TID) | ORAL | 0 refills | Status: AC

## 2018-04-11 MED ORDER — CYCLOBENZAPRINE HCL 10 MG PO TABS: 10.0000 mg | ORAL_TABLET | Freq: Two times a day (BID) | ORAL | 0 refills | Status: AC | PRN

## 2018-04-11 MED ORDER — IBUPROFEN 800 MG PO TABS
800.0000 mg | ORAL_TABLET | Freq: Once | ORAL | Status: AC
  Administered 2018-04-11: 800 mg via ORAL
  Filled 2018-04-11: qty 1
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
## 2018-04-11 NOTE — Discharge Instructions (Addendum)
Motrin and Flexeril as needed as directed for muscle soreness.  Do not take Flexeril if driving or operating machinery. Use left wrist brace for 1 week, follow-up with your doctor if pain persists.
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
